# Patient Record
Sex: Female | Born: 1940 | Race: White | Hispanic: No | Marital: Married | Smoking: Never smoker
Health system: Southern US, Community
[De-identification: ages and names within clinical notes are randomized; demographics above are authoritative.]

## PROBLEM LIST (undated history)

## (undated) DIAGNOSIS — K635 Polyp of colon: Secondary | ICD-10-CM

## (undated) DIAGNOSIS — I509 Heart failure, unspecified: Secondary | ICD-10-CM

## (undated) DIAGNOSIS — I639 Cerebral infarction, unspecified: Secondary | ICD-10-CM

## (undated) DIAGNOSIS — I4891 Unspecified atrial fibrillation: Secondary | ICD-10-CM

## (undated) HISTORY — DX: Cerebral infarction, unspecified: I63.9

## (undated) HISTORY — DX: Unspecified atrial fibrillation: I48.91

## (undated) HISTORY — DX: Polyp of colon: K63.5

## (undated) HISTORY — DX: Heart failure, unspecified: I50.9

## (undated) HISTORY — PX: BACK SURGERY: SHX140

## (undated) HISTORY — PX: OTHER SURGICAL HISTORY: SHX169

## (undated) HISTORY — PX: CHOLECYSTECTOMY: SHX55

---

## 2004-06-04 ENCOUNTER — Ambulatory Visit (HOSPITAL_COMMUNITY): Admission: RE | Admit: 2004-06-04 | Discharge: 2004-06-04 | Payer: Self-pay | Admitting: Neurological Surgery

## 2006-05-07 IMAGING — RF DG MYELOGRAM LUMBAR
14 of 16 series · 14 of 16 positions shown · non-contrast
Comparison: none

CLINICAL DATA: Back and leg pain.  Pain particularly on the left. 
 LUMBAR MYELOGRAM:
 Lumbar puncture was performed by Dr. Satiago from a left-sided approach at the L3-4 interspace.  
 There is scoliosis of the spine convex to the left with the apex at L2-3.  No extradural defects are seen at L3-4 or above.  At L4-5, there is mild narrowing of the canal with anterior and posterior contributors.  The L5 root sleeves show slight thinning.  At L5-S1, I think there is probably subarticular lateral recess stenosis.  This will be better shown at CT.
 Standing lateral flexion and extension views did not show any abnormal motion.

[Series 1: run · 1 of 1 slices shown (1 of 14)]
[im 1/1]
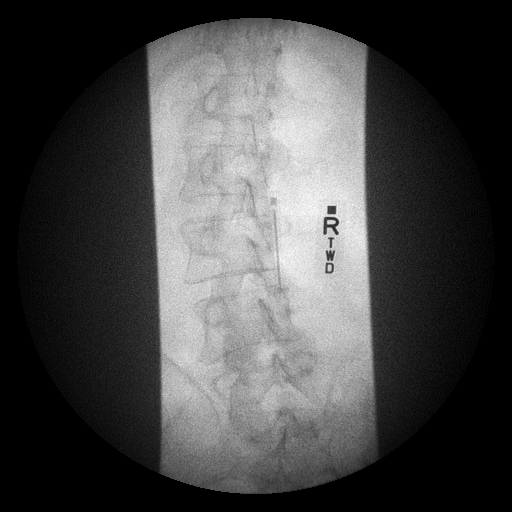

[Series 2: run · 1 of 1 slices shown (2 of 14)]
[im 1/1]
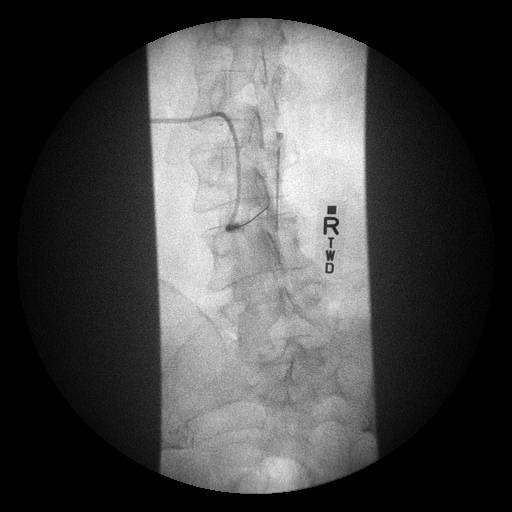

[Series 3: run · 1 of 1 slices shown (3 of 14)]
[im 1/1]
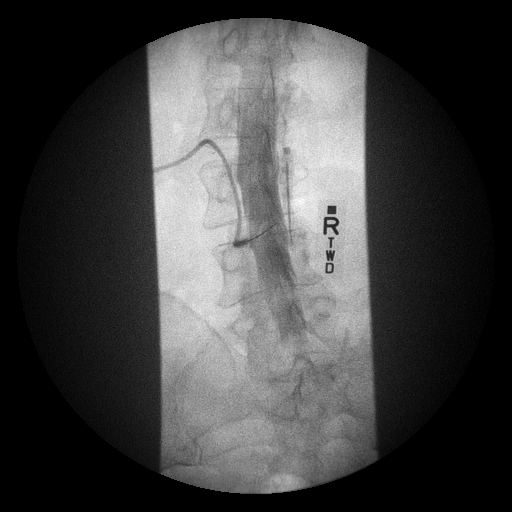

[Series 5: run · 1 of 1 slices shown (4 of 14)]
[im 1/1]
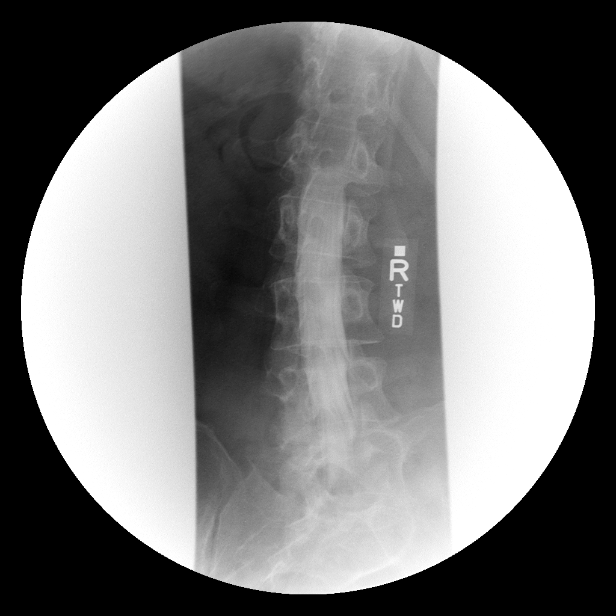

[Series 6: run · 1 of 1 slices shown (5 of 14)]
[im 1/1]
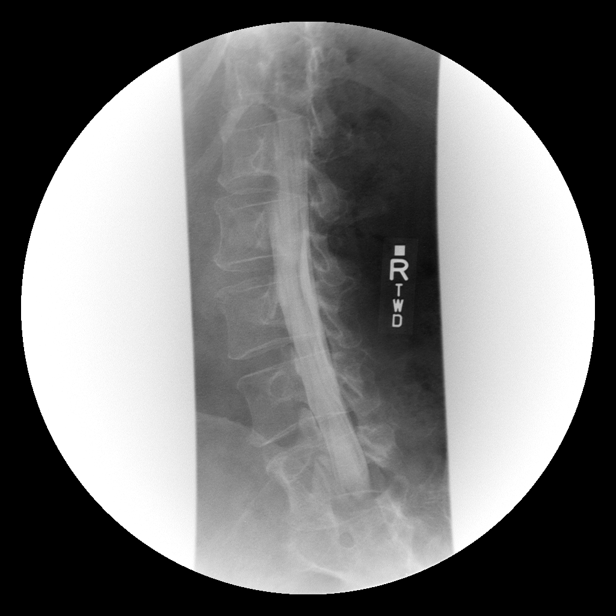

[Series 7: run · 1 of 1 slices shown (6 of 14)]
[im 1/1]
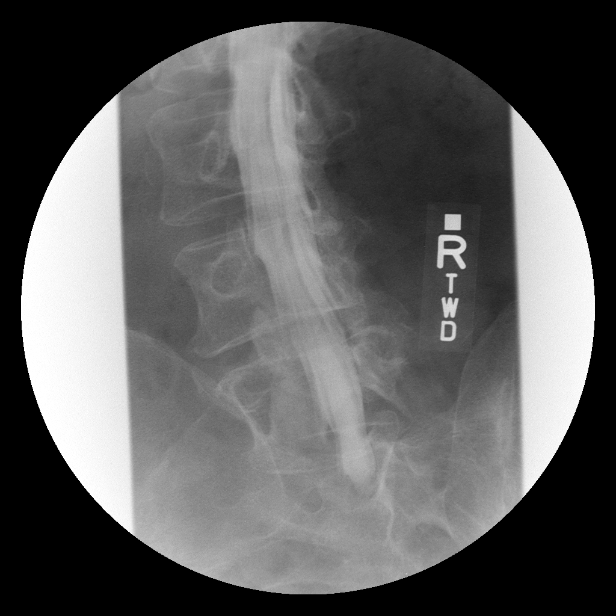

[Series 8: run · 1 of 1 slices shown (7 of 14)]
[im 1/1]
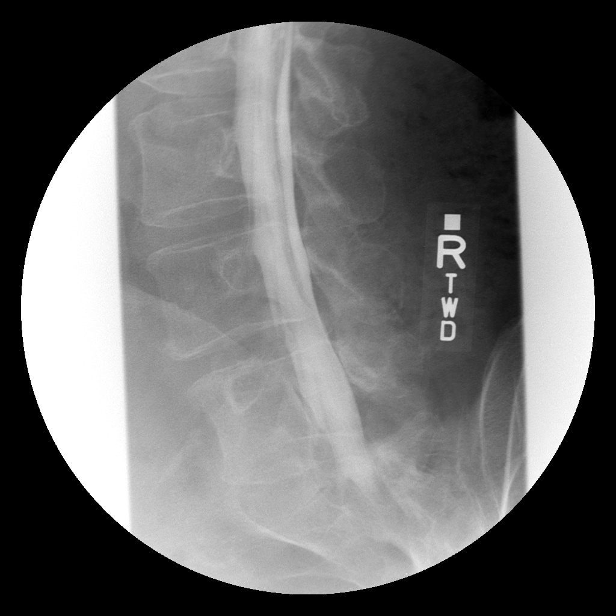

[Series 9: run · 1 of 1 slices shown (8 of 14)]
[im 1/1]
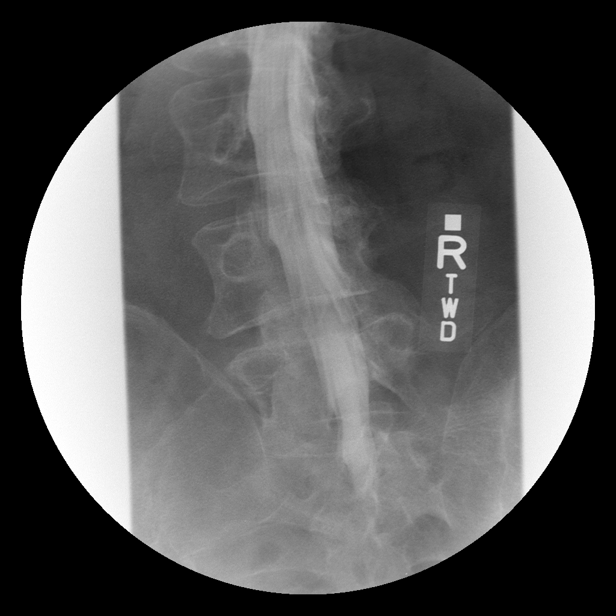

[Series 10: run · 1 of 1 slices shown (9 of 14)]
[im 1/1]
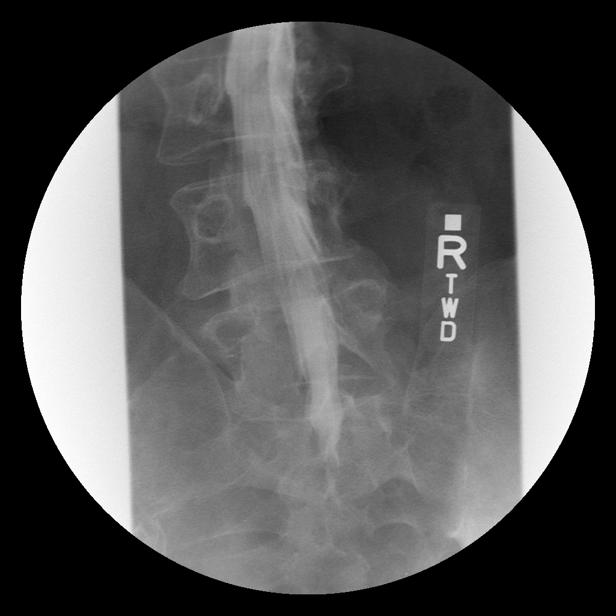

[Series 11: run · 1 of 1 slices shown (10 of 14)]
[im 1/1]
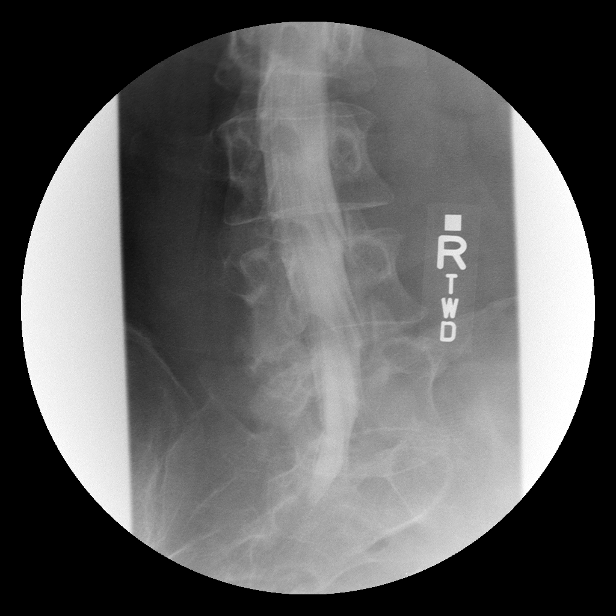

[Series 13: run · 1 of 1 slices shown (11 of 14)]
[im 1/1]
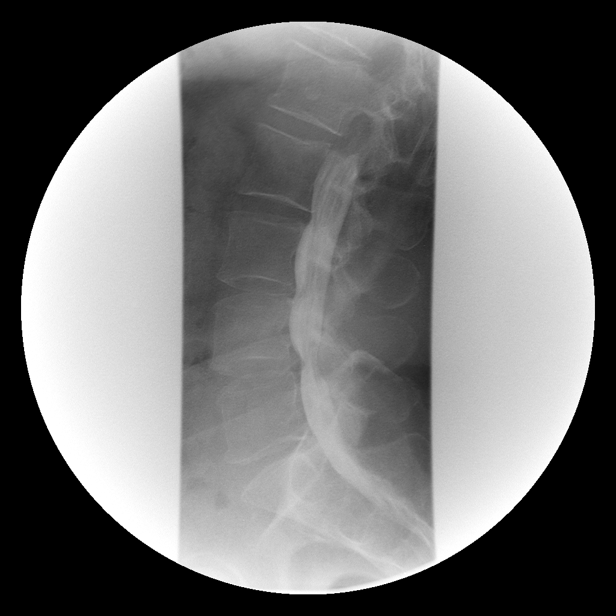

[Series 14: run · 1 of 1 slices shown (12 of 14)]
[im 1/1]
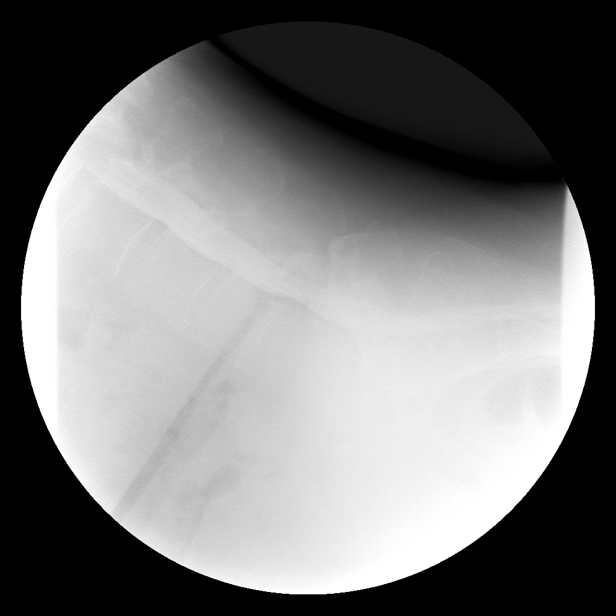

[Series 15: run · 1 of 1 slices shown (13 of 14)]
[im 1/1]
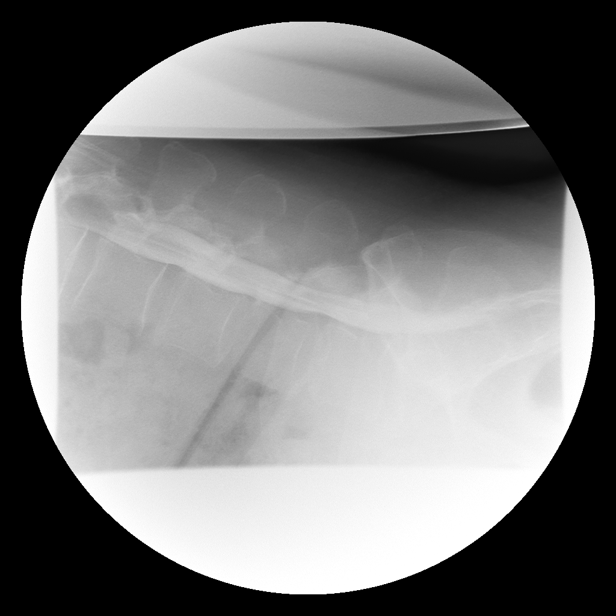

[Series 16: run · 1 of 1 slices shown (14 of 14)]
[im 1/1]
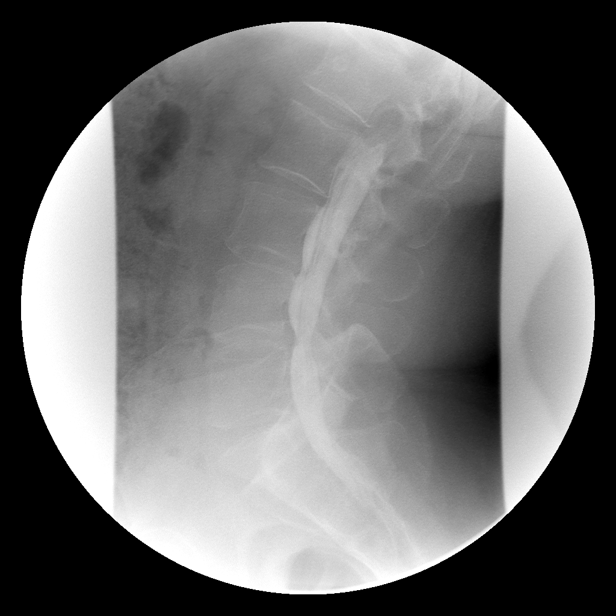

[14 of 16 positions shown; findings below may reference images not displayed]

IMPRESSION: 1.  Mild multifactorial stenosis at L4-5. 
 2.  Question subarticular lateral recess stenoses at L5-S1.  
 POSTMYELOGRAM CT SCAN LUMBAR SPINE:
 Spiral scanning is performed from L1 to S1 after myelography.  Individual disc spaces are evaluated as follows. 
 L1-2:  Normal interspace. 
 L2-3:  Normal interspace.    
 L3-4:  Minimal disc bulge but no herniation or stenosis.  
 L4-5:  There is a shallow broad based disc protrusion.  The facets show mild hypertrophic degenerative change with some vacuum phenomenon on the left. There is mild narrowing of the lateral recesses.  The L4 nerve roots exit the foramina freely but it is possible either L5 nerve root could be effect in the lateral recess.  Certainly, definite compression is not established.  
 L5-S1:  There is a shallow broad based disc herniation.  This does not have any effect upon the thecal sac.  The facets and ligaments are mildly hypertrophic.  There is mild narrowing of the subarticular lateral recesses but definite S1 compression is not established.  The neural foramina appears sufficiently patent.
IMPRESSION: 1.  Mild multifactorial stenosis at L4-5 on the basis of a shallow broad based disc protrusion and facet and ligamentous hypertrophy.  There is vacuum phenomenon with respect to the facet joint on the left.  The lateral recesses are mildly narrowed but there is not definite compression of either L5 nerve root.  
 2.  Mild bilateral subarticular lateral recess stenosis at L5-S1.  There is a shallow broad based disc protrusion and there is mild facet and ligamentous hypertrophy.  As at the level above, definite neural compression is not established however.

## 2016-11-08 LAB — HM COLONOSCOPY

## 2017-11-07 LAB — HM MAMMOGRAPHY

## 2017-11-14 ENCOUNTER — Telehealth: Payer: Self-pay

## 2017-11-14 NOTE — Telephone Encounter (Signed)
Pt called stating that her daughter, Brianna Best (MRN [id number]) is a [name] pt and she was really wanting to get established with [name].   Pt states she has a PCP right now, but wants a new one that is closer to home, and she wants a female.   I advised pt [name] is not currently taking new patients but I could ask.   Please advise

## 2017-11-14 NOTE — Telephone Encounter (Signed)
I'm good with that. Thanks for asking!

## 2017-11-15 NOTE — Telephone Encounter (Signed)
Called home number- man answered phone and stated she was not home- gave me cell number for pt to try [phone].  Will try this number at a later time

## 2017-11-16 LAB — HM DEXA SCAN

## 2017-11-16 NOTE — Telephone Encounter (Signed)
Pt scheduled for 12-11-17 at 9:30

## 2017-12-01 LAB — BASIC METABOLIC PANEL
BUN: 14 (ref 4–21)
CREATININE: 0.8 (ref 0.5–1.1)
Glucose: 65
Glucose: 65
Potassium: 3.4 (ref 3.4–5.3)
Sodium: 144 (ref 137–147)

## 2017-12-01 LAB — LIPID PANEL
Cholesterol: 213 — AB (ref 0–200)
HDL: 30 — AB (ref 35–70)
LDL Cholesterol: 157
Triglycerides: 131 (ref 40–160)

## 2017-12-01 LAB — HEPATIC FUNCTION PANEL
ALK PHOS: 71 (ref 25–125)
ALT: 22 (ref 7–35)
AST: 30 (ref 13–35)
BILIRUBIN, TOTAL: 0.7

## 2017-12-01 LAB — VITAMIN D 25 HYDROXY (VIT D DEFICIENCY, FRACTURES): VIT D 25 HYDROXY: 9.8

## 2017-12-11 ENCOUNTER — Ambulatory Visit (INDEPENDENT_AMBULATORY_CARE_PROVIDER_SITE_OTHER): Payer: Medicare HMO | Admitting: Physician Assistant

## 2017-12-11 ENCOUNTER — Encounter: Payer: Self-pay | Admitting: Physician Assistant

## 2017-12-11 VITALS — BP 129/62 | HR 70 | Ht 64.0 in | Wt 149.0 lb

## 2017-12-11 DIAGNOSIS — G629 Polyneuropathy, unspecified: Secondary | ICD-10-CM

## 2017-12-11 DIAGNOSIS — I482 Chronic atrial fibrillation, unspecified: Secondary | ICD-10-CM

## 2017-12-11 DIAGNOSIS — I619 Nontraumatic intracerebral hemorrhage, unspecified: Secondary | ICD-10-CM | POA: Diagnosis not present

## 2017-12-11 DIAGNOSIS — I5030 Unspecified diastolic (congestive) heart failure: Secondary | ICD-10-CM

## 2017-12-11 DIAGNOSIS — E876 Hypokalemia: Secondary | ICD-10-CM

## 2017-12-11 DIAGNOSIS — C8301 Small cell B-cell lymphoma, lymph nodes of head, face, and neck: Secondary | ICD-10-CM

## 2017-12-11 DIAGNOSIS — I422 Other hypertrophic cardiomyopathy: Secondary | ICD-10-CM | POA: Diagnosis not present

## 2017-12-11 DIAGNOSIS — E782 Mixed hyperlipidemia: Secondary | ICD-10-CM

## 2017-12-12 LAB — BASIC METABOLIC PANEL
BUN: 12 mg/dL (ref 7–25)
CALCIUM: 10.2 mg/dL (ref 8.6–10.4)
CO2: 30 mmol/L (ref 20–32)
Chloride: 105 mmol/L (ref 98–110)
Creat: 0.68 mg/dL (ref 0.60–0.93)
GLUCOSE: 104 mg/dL — AB (ref 65–99)
Potassium: 4.2 mmol/L (ref 3.5–5.3)
Sodium: 143 mmol/L (ref 135–146)

## 2017-12-12 LAB — CBC WITH DIFFERENTIAL/PLATELET
Basophils Absolute: 19 cells/uL (ref 0–200)
Basophils Relative: 0.3 %
Eosinophils Absolute: 99 cells/uL (ref 15–500)
Eosinophils Relative: 1.6 %
HCT: 37.9 % (ref 35.0–45.0)
Hemoglobin: 13.1 g/dL (ref 11.7–15.5)
Lymphs Abs: 2269 cells/uL (ref 850–3900)
MCH: 32.3 pg (ref 27.0–33.0)
MCHC: 34.6 g/dL (ref 32.0–36.0)
MCV: 93.3 fL (ref 80.0–100.0)
MPV: 10.6 fL (ref 7.5–12.5)
Monocytes Relative: 12.4 %
NEUTROS PCT: 49.1 %
Neutro Abs: 3044 cells/uL (ref 1500–7800)
PLATELETS: 180 10*3/uL (ref 140–400)
RBC: 4.06 10*6/uL (ref 3.80–5.10)
RDW: 13.4 % (ref 11.0–15.0)
TOTAL LYMPHOCYTE: 36.6 %
WBC: 6.2 10*3/uL (ref 3.8–10.8)
WBCMIX: 769 {cells}/uL (ref 200–950)

## 2017-12-12 LAB — FERRITIN: Ferritin: 182 ng/mL (ref 16–288)

## 2017-12-12 LAB — B12 AND FOLATE PANEL
FOLATE: 18.8 ng/mL
Vitamin B-12: >2000 pg/mL — ABNORMAL HIGH (ref 200–1100)

## 2017-12-12 LAB — VITAMIN D 25 HYDROXY (VIT D DEFICIENCY, FRACTURES): Vit D, 25-Hydroxy: 52 ng/mL (ref 30–100)

## 2017-12-12 NOTE — Progress Notes (Signed)
Call pt: potassium perfect today. Normal hemoglobin and iron stores.  Great vitamin D.  Labs look amazing.

## 2017-12-17 ENCOUNTER — Encounter: Payer: Self-pay | Admitting: Physician Assistant

## 2017-12-17 ENCOUNTER — Telehealth: Payer: Self-pay | Admitting: Physician Assistant

## 2017-12-17 DIAGNOSIS — I482 Chronic atrial fibrillation, unspecified: Secondary | ICD-10-CM | POA: Insufficient documentation

## 2017-12-17 DIAGNOSIS — E876 Hypokalemia: Secondary | ICD-10-CM | POA: Insufficient documentation

## 2017-12-17 DIAGNOSIS — G629 Polyneuropathy, unspecified: Secondary | ICD-10-CM | POA: Insufficient documentation

## 2017-12-17 NOTE — Telephone Encounter (Signed)
Can we abstract in mammogram, bone density, last labs at 11/30/2017, colonoscopy, I reconciled pneumonia shots but unsure if they updated health maintenance.

## 2017-12-17 NOTE — Progress Notes (Signed)
Subjective:    Patient ID: Brianna Best, female    DOB: Oct 12, 1940, 77 y.o.   MRN: [id number]  HPI Pt is a 77 yo female with hx of stroke, chronic afib, CHF, lymphoma who presents to the clinic to establish care. She works very closely with cardiology due to her chronic afib and cardiomypathy/CHF.  She is doing well and continues to follow up with Dr. Verdell Carmine.   Overall she feels great today with no complaints.   .. Active Ambulatory Problems    Diagnosis Date Noted  . 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 Hypertrophic cardiomyopathy ([place]) 12/11/2017  . Hemorrhagic stroke ([place]) 12/11/2017  . CHF (congestive heart failure), NYHA class I, unspecified failure chronicity, diastolic ([place]) 80/99/8338  . Neuropathy 12/17/2017  . Hypokalemia 12/17/2017  . Chronic atrial fibrillation ([place]) 12/17/2017   Resolved Ambulatory Problems    Diagnosis Date Noted  . No Resolved Ambulatory Problems   Past Medical History:  Diagnosis Date  . Atrial fibrillation ([place])   . Colon polyps   . Heart failure ([place])   . Stroke (cerebrum) ([place])    .Marland Kitchen Family History  Problem Relation Age of Onset  . Hypertension Brother    .Marland Kitchen Social History   Socioeconomic History  . Marital status: Married    Spouse name: Not on file  . Number of children: Not on file  . Years of education: Not on file  . Highest education level: Not on file  Occupational History  . Not on file  Social Needs  . Financial resource strain: Not on file  . Food insecurity:    Worry: Not on file    Inability: Not on file  . Transportation needs:    Medical: Not on file    Non-medical: Not on file  Tobacco Use  . Smoking status: Never Smoker  . Smokeless tobacco: Never Used  Substance and Sexual Activity  . Alcohol use: Not Currently  . Drug use: Never  . Sexual activity: Not on file  Lifestyle  . Physical activity:    Days per week: Not on file    Minutes per session: Not on file  .  Stress: Not on file  Relationships  . Social connections:    Talks on phone: Not on file    Gets together: Not on file    Attends religious service: Not on file    Active member of club or organization: Not on file    Attends meetings of clubs or organizations: Not on file    Relationship status: Not on file  . Intimate partner violence:    Fear of current or ex partner: Not on file    Emotionally abused: Not on file    Physically abused: Not on file    Forced sexual activity: Not on file  Other Topics Concern  . Not on file  Social History Narrative  . Not on file        Review of Systems  All other systems reviewed and are negative.      Objective:   Physical Exam  Constitutional: She is oriented to person, place, and time. She appears well-developed and well-nourished.  HENT:  Head: Normocephalic and atraumatic.  Neck: Normal range of motion. Neck supple.  Cardiovascular: Normal rate and regular rhythm.  Murmur heard. Pulmonary/Chest: Effort normal and breath sounds normal.  Lymphadenopathy:    She has no cervical adenopathy.  Neurological: She is alert and oriented to person,  place, and time.  Psychiatric: She has a normal mood and affect. Her behavior is normal.          Assessment & Plan:  Marland KitchenMarland KitchenDiagnoses and all orders for this visit:  Hemorrhagic stroke (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    Labs done 11/30/2017 potasium 3.4 will recheck.   No medication refills needed today.   She does continue to have neuropathy symptoms despite being on gabapentin. Will check some labs to see if any other metabolic reasons for neuropathy.

## 2017-12-28 ENCOUNTER — Encounter: Payer: Self-pay | Admitting: Physician Assistant

## 2018-01-08 ENCOUNTER — Encounter: Payer: Self-pay | Admitting: Physician Assistant

## 2018-03-13 ENCOUNTER — Ambulatory Visit (INDEPENDENT_AMBULATORY_CARE_PROVIDER_SITE_OTHER): Payer: Medicare HMO | Admitting: Physician Assistant

## 2018-03-13 ENCOUNTER — Encounter: Payer: Self-pay | Admitting: Physician Assistant

## 2018-03-13 VITALS — BP 136/68 | HR 71 | Temp 98.4°F | Ht 64.0 in | Wt 150.0 lb

## 2018-03-13 DIAGNOSIS — E785 Hyperlipidemia, unspecified: Secondary | ICD-10-CM

## 2018-03-13 DIAGNOSIS — J40 Bronchitis, not specified as acute or chronic: Secondary | ICD-10-CM

## 2018-03-13 DIAGNOSIS — J329 Chronic sinusitis, unspecified: Secondary | ICD-10-CM

## 2018-03-13 MED ORDER — AZITHROMYCIN 250 MG PO TABS: ORAL_TABLET | ORAL | 0 refills | Status: AC

## 2018-03-13 MED ORDER — METHYLPREDNISOLONE SODIUM SUCC 125 MG IJ SOLR
125.0000 mg | Freq: Once | INTRAMUSCULAR | Status: AC
  Administered 2018-03-13: 125 mg via INTRAMUSCULAR

## 2018-03-13 NOTE — Progress Notes (Signed)
136/68 71 68.4 Oral

## 2018-03-13 NOTE — Progress Notes (Signed)
   Subjective:    Patient ID: Brianna Best, female    DOB: Apr 17, 1941, 77 y.o.   MRN: [id number]  HPI  Pt is a 77 yo female with A.Fib, CHF, Hypertrophic Cardiomyopathy, hemorrhagic stroke, with a hx of pneumonia that kept her in the hospital last year. She is concerned about her URI symptoms developing into that again. Symptoms for the last 2 weeks. She thought was getting better then got worse.  She is having some sinus pressure and congestion that "seems to be moving into her chest". She does feel some SOB and coughing a lot. She has been doing a lot of mucinex and sinus rinses. She continues on her azelastine nasal spray. No fever, chills, body aches. No CP.  Her last lipid panel was 11/2017- TC 213, HDL 30, LDL 157. She did not want to be on a statin due to myaglias and try diet changes and fish oil.   .. Active Ambulatory Problems    Diagnosis Date Noted  . 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 Hypertrophic cardiomyopathy ([place]) 12/11/2017  . Hemorrhagic stroke ([place]) 12/11/2017  . CHF (congestive heart failure), NYHA class I, unspecified failure chronicity, diastolic ([place]) 34/28/7681  . Neuropathy 12/17/2017  . Hypokalemia 12/17/2017  . Chronic atrial fibrillation 12/17/2017  . Dyslipidemia 03/13/2018   Resolved Ambulatory Problems    Diagnosis Date Noted  . No Resolved Ambulatory Problems   Past Medical History:  Diagnosis Date  . Atrial fibrillation ([place])   . Colon polyps   . Heart failure ([place])   . Stroke (cerebrum) [facility])      Review of Systems    see HPI.  Objective:   Physical Exam  Constitutional: She appears well-developed and well-nourished.  HENT:  Head: Normocephalic and atraumatic.  Right Ear: External ear normal.  Left Ear: External ear normal.  Nose: Nose normal.  Mouth/Throat: Oropharynx is clear and moist.  TM's clear.  Tenderness around nasal bridge, maxillary sinuses.   Eyes: Conjunctivae are normal.  Neck: Normal range  of motion. Neck supple.  Cardiovascular: Normal rate and regular rhythm.  No murmur heard. Pulmonary/Chest: Effort normal. She has no wheezes.  Rhonchi scattered.   Lymphadenopathy:    She has no cervical adenopathy.          Assessment & Plan:  Marland KitchenMarland KitchenDiagnoses and all orders for this visit:  Sinobronchitis -     azithromycin (ZITHROMAX) 250 MG tablet; Take 2 tablets now and then one tablet for 4 days. -     methylPREDNISolone sodium succinate (SOLU-MEDROL) 125 mg/2 mL injection 125 mg  Dyslipidemia   Treated with zpak/solumedrol. HO given. Follow up as needed. HO given.   Discussed trying livalo to see if better statin response. Also could consider taking the lowest non side effect dose even at 2-3 times a week. Need blood work she will call when she can afford some blood work to recheck lipid.    Per patient has colonoscopy done at [facility]. Will fax to get records.

## 2018-03-13 NOTE — Patient Instructions (Signed)

## 2018-03-19 ENCOUNTER — Encounter: Payer: Self-pay | Admitting: Physician Assistant

## 2018-09-11 ENCOUNTER — Ambulatory Visit: Payer: Medicare HMO | Admitting: Physician Assistant
